# Patient Record
Sex: Female | Born: 1995 | Race: Black or African American | Hispanic: No | Marital: Single | Smoking: Never smoker
Health system: Southern US, Community
[De-identification: ages and names within clinical notes are randomized; demographics above are authoritative.]

## PROBLEM LIST (undated history)

## (undated) HISTORY — PX: TUBAL LIGATION: SHX77

---

## 2018-08-01 ENCOUNTER — Emergency Department (HOSPITAL_COMMUNITY): Payer: Medicaid Other

## 2018-08-01 ENCOUNTER — Emergency Department (HOSPITAL_COMMUNITY)
Admission: EM | Admit: 2018-08-01 | Discharge: 2018-08-01 | Disposition: A | Discharge status: Home / Self Care | Payer: Medicaid Other | Attending: Emergency Medicine | Admitting: Emergency Medicine

## 2018-08-01 ENCOUNTER — Other Ambulatory Visit: Payer: Self-pay

## 2018-08-01 DIAGNOSIS — T1490XA Injury, unspecified, initial encounter: Secondary | ICD-10-CM

## 2018-08-01 DIAGNOSIS — Y999 Unspecified external cause status: Secondary | ICD-10-CM | POA: Insufficient documentation

## 2018-08-01 DIAGNOSIS — S30811A Abrasion of abdominal wall, initial encounter: Secondary | ICD-10-CM | POA: Insufficient documentation

## 2018-08-01 DIAGNOSIS — Y929 Unspecified place or not applicable: Secondary | ICD-10-CM | POA: Insufficient documentation

## 2018-08-01 DIAGNOSIS — T07XXXA Unspecified multiple injuries, initial encounter: Secondary | ICD-10-CM | POA: Diagnosis present

## 2018-08-01 DIAGNOSIS — S3210XA Unspecified fracture of sacrum, initial encounter for closed fracture: Secondary | ICD-10-CM

## 2018-08-01 DIAGNOSIS — M549 Dorsalgia, unspecified: Secondary | ICD-10-CM | POA: Diagnosis not present

## 2018-08-01 DIAGNOSIS — Y939 Activity, unspecified: Secondary | ICD-10-CM | POA: Diagnosis not present

## 2018-08-01 DIAGNOSIS — S01511A Laceration without foreign body of lip, initial encounter: Secondary | ICD-10-CM | POA: Diagnosis not present

## 2018-08-01 DIAGNOSIS — R51 Headache: Secondary | ICD-10-CM | POA: Diagnosis not present

## 2018-08-01 DIAGNOSIS — M542 Cervicalgia: Secondary | ICD-10-CM | POA: Diagnosis not present

## 2018-08-01 DIAGNOSIS — R52 Pain, unspecified: Secondary | ICD-10-CM

## 2018-08-01 LAB — I-STAT CHEM 8, ED
BUN: 9 mg/dL (ref 6–20)
Calcium, Ion: 1.22 mmol/L (ref 1.15–1.40)
Chloride: 107 mmol/L (ref 98–111)
Creatinine, Ser: 1 mg/dL (ref 0.44–1.00)
Glucose, Bld: 112 mg/dL — ABNORMAL HIGH (ref 70–99)
HCT: 37 % (ref 36.0–46.0)
Hemoglobin: 12.6 g/dL (ref 12.0–15.0)
Potassium: 3.8 mmol/L (ref 3.5–5.1)
Sodium: 141 mmol/L (ref 135–145)
TCO2: 26 mmol/L (ref 22–32)

## 2018-08-01 LAB — I-STAT BETA HCG BLOOD, ED (MC, WL, AP ONLY): I-stat hCG, quantitative: <5 m[IU]/mL (ref <5)

## 2018-08-01 LAB — ETHANOL: Alcohol, Ethyl (B): <10 mg/dL (ref <10)

## 2018-08-01 MED ORDER — OXYCODONE-ACETAMINOPHEN 5-325 MG PO TABS: 2.0000 | ORAL_TABLET | ORAL | 0 refills | Status: DC | PRN

## 2018-08-01 MED ORDER — IOHEXOL 300 MG/ML  SOLN
100.0000 mL | Freq: Once | INTRAMUSCULAR | Status: AC | PRN
  Administered 2018-08-01: 100 mL via INTRAVENOUS

## 2018-08-01 MED ORDER — MORPHINE SULFATE (PF) 2 MG/ML IV SOLN
2.0000 mg | Freq: Once | INTRAVENOUS | Status: AC
  Administered 2018-08-01: 2 mg via INTRAVENOUS
  Filled 2018-08-01: qty 1

## 2018-08-01 MED ORDER — OXYCODONE-ACETAMINOPHEN 5-325 MG PO TABS
2.0000 | ORAL_TABLET | Freq: Once | ORAL | Status: AC
  Administered 2018-08-01: 1 via ORAL
  Filled 2018-08-01: qty 2
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
## 2018-08-01 NOTE — ED Notes (Signed)
[phone]; pt provided family's number. No answer. Left message.
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
## 2018-08-01 NOTE — ED Notes (Signed)
Patient transported to CT 

## 2018-08-01 NOTE — ED Notes (Signed)
Attempted to ambulate pt. Not able to stand without immense pain.

## 2018-08-01 NOTE — Discharge Instructions (Signed)
Please return if you have worsening shortness of breath. Take pain medicine as needed for tail bone fracture.   Do not drive until you are seen by neurology and cleared Keep food out of lip cut and use saline swish and swallow

## 2018-08-05 NOTE — ED Notes (Signed)
Only gave pt one percocet per md prior to discharge.

## 2018-08-06 NOTE — ED Notes (Signed)
Extra percocet returned to pharmacy.

## 2018-10-04 ENCOUNTER — Other Ambulatory Visit: Payer: Self-pay

## 2018-10-04 ENCOUNTER — Emergency Department (HOSPITAL_COMMUNITY): Payer: Medicaid Other | Admitting: Anesthesiology

## 2018-10-04 ENCOUNTER — Encounter (HOSPITAL_COMMUNITY)
Admission: EM | Disposition: A | Discharge status: Home / Self Care | Payer: Self-pay | Source: Home / Self Care | Attending: Emergency Medicine

## 2018-10-04 ENCOUNTER — Encounter (HOSPITAL_COMMUNITY): Payer: Self-pay | Admitting: Family Medicine

## 2018-10-04 ENCOUNTER — Ambulatory Visit (HOSPITAL_COMMUNITY)
Admission: EM | Admit: 2018-10-04 | Discharge: 2018-10-04 | Disposition: A | Discharge status: Home / Self Care | Payer: Medicaid Other | Attending: Family Medicine | Admitting: Family Medicine

## 2018-10-04 DIAGNOSIS — D62 Acute posthemorrhagic anemia: Secondary | ICD-10-CM | POA: Diagnosis not present

## 2018-10-04 DIAGNOSIS — X58XXXA Exposure to other specified factors, initial encounter: Secondary | ICD-10-CM | POA: Diagnosis not present

## 2018-10-04 DIAGNOSIS — S39013A Strain of muscle, fascia and tendon of pelvis, initial encounter: Secondary | ICD-10-CM | POA: Diagnosis present

## 2018-10-04 DIAGNOSIS — Y9389 Activity, other specified: Secondary | ICD-10-CM | POA: Insufficient documentation

## 2018-10-04 DIAGNOSIS — I10 Essential (primary) hypertension: Secondary | ICD-10-CM | POA: Diagnosis not present

## 2018-10-04 DIAGNOSIS — S3141XA Laceration without foreign body of vagina and vulva, initial encounter: Secondary | ICD-10-CM | POA: Diagnosis present

## 2018-10-04 DIAGNOSIS — Z79899 Other long term (current) drug therapy: Secondary | ICD-10-CM | POA: Diagnosis not present

## 2018-10-04 DIAGNOSIS — I959 Hypotension, unspecified: Secondary | ICD-10-CM

## 2018-10-04 DIAGNOSIS — N939 Abnormal uterine and vaginal bleeding, unspecified: Secondary | ICD-10-CM | POA: Diagnosis not present

## 2018-10-04 HISTORY — PX: LACERATION REPAIR: SHX5284

## 2018-10-04 LAB — TYPE AND SCREEN
ABO/RH(D): O POS
Antibody Screen: NEGATIVE

## 2018-10-04 LAB — CBC WITH DIFFERENTIAL/PLATELET
Abs Immature Granulocytes: 0.03 10*3/uL (ref 0.00–0.07)
Basophils Absolute: 0 10*3/uL (ref 0.0–0.1)
Basophils Relative: 0 %
Eosinophils Absolute: 0.1 10*3/uL (ref 0.0–0.5)
Eosinophils Relative: 1 %
HCT: 31.1 % — ABNORMAL LOW (ref 36.0–46.0)
Hemoglobin: 9.9 g/dL — ABNORMAL LOW (ref 12.0–15.0)
Immature Granulocytes: 0 %
Lymphocytes Relative: 23 %
Lymphs Abs: 2.7 10*3/uL (ref 0.7–4.0)
MCH: 27.1 pg (ref 26.0–34.0)
MCHC: 31.8 g/dL (ref 30.0–36.0)
MCV: 85.2 fL (ref 80.0–100.0)
Monocytes Absolute: 1 10*3/uL (ref 0.1–1.0)
Monocytes Relative: 8 %
Neutro Abs: 7.7 10*3/uL (ref 1.7–7.7)
Neutrophils Relative %: 68 %
Platelets: 289 10*3/uL (ref 150–400)
RBC: 3.65 MIL/uL — ABNORMAL LOW (ref 3.87–5.11)
RDW: 14.6 % (ref 11.5–15.5)
WBC: 11.4 10*3/uL — ABNORMAL HIGH (ref 4.0–10.5)
nRBC: 0 % (ref 0.0–0.2)

## 2018-10-04 LAB — BASIC METABOLIC PANEL
Anion gap: 8 (ref 5–15)
BUN: 6 mg/dL (ref 6–20)
CO2: 23 mmol/L (ref 22–32)
Calcium: 8.7 mg/dL — ABNORMAL LOW (ref 8.9–10.3)
Chloride: 106 mmol/L (ref 98–111)
Creatinine, Ser: 1.03 mg/dL — ABNORMAL HIGH (ref 0.44–1.00)
GFR calc Af Amer: >60 mL/min (ref >60)
GFR calc non Af Amer: >60 mL/min (ref >60)
Glucose, Bld: 147 mg/dL — ABNORMAL HIGH (ref 70–99)
Potassium: 3.1 mmol/L — ABNORMAL LOW (ref 3.5–5.1)
Sodium: 137 mmol/L (ref 135–145)

## 2018-10-04 LAB — WET PREP, GENITAL
Clue Cells Wet Prep HPF POC: NONE SEEN
Sperm: NONE SEEN
Trich, Wet Prep: NONE SEEN
Yeast Wet Prep HPF POC: NONE SEEN

## 2018-10-04 LAB — HCG, QUANTITATIVE, PREGNANCY: hCG, Beta Chain, Quant, S: <1 m[IU]/mL (ref <5)

## 2018-10-04 LAB — HIV ANTIBODY (ROUTINE TESTING W REFLEX): HIV Screen 4th Generation wRfx: NONREACTIVE

## 2018-10-04 LAB — ABO/RH: ABO/RH(D): O POS

## 2018-10-04 LAB — SARS CORONAVIRUS 2 BY RT PCR (HOSPITAL ORDER, PERFORMED IN ~~LOC~~ HOSPITAL LAB): SARS Coronavirus 2: NEGATIVE

## 2018-10-04 LAB — RPR: RPR Ser Ql: NONREACTIVE

## 2018-10-04 SURGERY — REPAIR, LACERATION, 2 OR MORE
Anesthesia: General | Site: Vagina

## 2018-10-04 MED ORDER — GLYCOPYRROLATE PF 0.2 MG/ML IJ SOSY
PREFILLED_SYRINGE | INTRAMUSCULAR | Status: DC | PRN
  Administered 2018-10-04: .2 mg via INTRAVENOUS

## 2018-10-04 MED ORDER — IBUPROFEN 800 MG PO TABS: 800.0000 mg | ORAL_TABLET | Freq: Three times a day (TID) | ORAL | 0 refills | Status: AC | PRN

## 2018-10-04 MED ORDER — SODIUM CHLORIDE 0.9 % IV BOLUS: 1000.0000 mL | Freq: Once | INTRAVENOUS | Status: DC

## 2018-10-04 MED ORDER — MIDAZOLAM HCL 2 MG/2ML IJ SOLN
INTRAMUSCULAR | Status: AC
  Filled 2018-10-04: qty 2

## 2018-10-04 MED ORDER — ONDANSETRON HCL 4 MG/2ML IJ SOLN
4.0000 mg | Freq: Once | INTRAMUSCULAR | Status: AC
  Administered 2018-10-04: 4 mg via INTRAVENOUS
  Filled 2018-10-04: qty 2

## 2018-10-04 MED ORDER — ONDANSETRON HCL 4 MG/2ML IJ SOLN
INTRAMUSCULAR | Status: DC | PRN
  Administered 2018-10-04: 4 mg via INTRAVENOUS

## 2018-10-04 MED ORDER — MIDAZOLAM HCL 2 MG/2ML IJ SOLN
INTRAMUSCULAR | Status: DC | PRN
  Administered 2018-10-04: 2 mg via INTRAVENOUS

## 2018-10-04 MED ORDER — STERILE WATER FOR INJECTION IJ SOLN
INTRAMUSCULAR | Status: AC
  Administered 2018-10-04: 09:00:00 1.2 mL
  Filled 2018-10-04: qty 10

## 2018-10-04 MED ORDER — BUPIVACAINE HCL (PF) 0.5 % IJ SOLN
INTRAMUSCULAR | Status: AC
  Filled 2018-10-04: qty 30

## 2018-10-04 MED ORDER — FENTANYL CITRATE (PF) 250 MCG/5ML IJ SOLN
INTRAMUSCULAR | Status: AC
  Filled 2018-10-04: qty 5

## 2018-10-04 MED ORDER — SUCCINYLCHOLINE CHLORIDE 200 MG/10ML IV SOSY
PREFILLED_SYRINGE | INTRAVENOUS | Status: DC | PRN
  Administered 2018-10-04: 160 mg via INTRAVENOUS

## 2018-10-04 MED ORDER — SODIUM CHLORIDE 0.9 % IV BOLUS
1000.0000 mL | Freq: Once | INTRAVENOUS | Status: AC
  Administered 2018-10-04: 08:00:00 1000 mL via INTRAVENOUS

## 2018-10-04 MED ORDER — PROPOFOL 10 MG/ML IV BOLUS
INTRAVENOUS | Status: DC | PRN
  Administered 2018-10-04: 130 mg via INTRAVENOUS

## 2018-10-04 MED ORDER — LIDOCAINE 2% (20 MG/ML) 5 ML SYRINGE
INTRAMUSCULAR | Status: DC | PRN
  Administered 2018-10-04: 60 mg via INTRAVENOUS

## 2018-10-04 MED ORDER — LACTATED RINGERS IV SOLN
INTRAVENOUS | Status: DC
  Administered 2018-10-04: 16:00:00 via INTRAVENOUS

## 2018-10-04 MED ORDER — AZITHROMYCIN 250 MG PO TABS
1000.0000 mg | ORAL_TABLET | Freq: Once | ORAL | Status: AC
  Administered 2018-10-04: 09:00:00 1000 mg via ORAL
  Filled 2018-10-04: qty 4

## 2018-10-04 MED ORDER — PROPOFOL 10 MG/ML IV BOLUS
INTRAVENOUS | Status: AC
  Filled 2018-10-04: qty 40

## 2018-10-04 MED ORDER — FENTANYL CITRATE (PF) 250 MCG/5ML IJ SOLN
INTRAMUSCULAR | Status: DC | PRN
  Administered 2018-10-04: 50 ug via INTRAVENOUS
  Administered 2018-10-04: 100 ug via INTRAVENOUS

## 2018-10-04 MED ORDER — CEFTRIAXONE SODIUM 250 MG IJ SOLR
250.0000 mg | Freq: Once | INTRAMUSCULAR | Status: AC
  Administered 2018-10-04: 09:00:00 250 mg via INTRAMUSCULAR
  Filled 2018-10-04: qty 250

## 2018-10-04 SURGICAL SUPPLY — 19 items
BLADE SURG 15 STRL LF DISP TIS (BLADE) ×1 IMPLANT
BLADE SURG 15 STRL SS (BLADE) ×2
CLOTH BEACON ORANGE TIMEOUT ST (SAFETY) ×3 IMPLANT
CONTAINER PREFILL 10% NBF 60ML (FORM) IMPLANT
COVER WAND RF STERILE (DRAPES) ×3 IMPLANT
GLOVE BIOGEL PI IND STRL 7.0 (GLOVE) ×2 IMPLANT
GLOVE BIOGEL PI INDICATOR 7.0 (GLOVE) ×4
GLOVE ECLIPSE 7.0 STRL STRAW (GLOVE) ×3 IMPLANT
GOWN STRL REUS W/ TWL LRG LVL3 (GOWN DISPOSABLE) ×2 IMPLANT
GOWN STRL REUS W/TWL LRG LVL3 (GOWN DISPOSABLE) ×4
KIT TURNOVER KIT B (KITS) ×3 IMPLANT
PACK VAGINAL MINOR WOMEN LF (CUSTOM PROCEDURE TRAY) ×3 IMPLANT
SUT CHROMIC 3 0 SH 27 (SUTURE) IMPLANT
SUT VIC AB 0 CT1 27 (SUTURE) ×2
SUT VIC AB 0 CT1 27XBRD ANBCTR (SUTURE) ×1 IMPLANT
SUT VIC AB 3-0 SH 27 (SUTURE) ×2
SUT VIC AB 3-0 SH 27XBRD (SUTURE) ×1 IMPLANT
TOWEL GREEN STERILE FF (TOWEL DISPOSABLE) ×6 IMPLANT
UNDERPAD 30X30 (UNDERPADS AND DIAPERS) ×3 IMPLANT

## 2018-10-04 NOTE — Transfer of Care (Signed)
Immediate Anesthesia Transfer of Care Note  Patient: Linda Logan  Procedure(s) Performed: VAGINAL LACERATION REPAIR (N/A Vagina )  Patient Location: PACU  Anesthesia Type:General  Level of Consciousness: awake, alert  and oriented  Airway & Oxygen Therapy: Patient Spontanous Breathing and Patient connected to face mask oxygen  Post-op Assessment: Report given to RN and Post -op Vital signs reviewed and stable  Post vital signs: Reviewed and stable  Last Vitals:  Vitals Value Taken Time  BP 118/77 10/04/18 1641  Temp 36.6 C 10/04/18 1641  Pulse 73 10/04/18 1641  Resp 19 10/04/18 1641  SpO2 100 % 10/04/18 1641    Last Pain:  Vitals:   10/04/18 1641  TempSrc:   PainSc: Asleep         Complications: No apparent anesthesia complications

## 2018-10-04 NOTE — Anesthesia Preprocedure Evaluation (Signed)
Anesthesia Evaluation  Patient identified by MRN, date of birth, ID band Patient awake    Reviewed: Allergy & Precautions, NPO status , Patient's Chart, lab work & pertinent test results  Airway Mallampati: I  TM Distance: >3 FB Neck ROM: Full    Dental   Pulmonary    Pulmonary exam normal        Cardiovascular Normal cardiovascular exam     Neuro/Psych    GI/Hepatic   Endo/Other    Renal/GU      Musculoskeletal   Abdominal   Peds  Hematology   Anesthesia Other Findings   Reproductive/Obstetrics                             Anesthesia Physical Anesthesia Plan  ASA: II and emergent  Anesthesia Plan: General   Post-op Pain Management:    Induction: Rapid sequence, Cricoid pressure planned and Intravenous  PONV Risk Score and Plan: 3 and Ondansetron and Midazolam  Airway Management Planned: Oral ETT  Additional Equipment:   Intra-op Plan:   Post-operative Plan: Extubation in OR  Informed Consent: I have reviewed the patients History and Physical, chart, labs and discussed the procedure including the risks, benefits and alternatives for the proposed anesthesia with the patient or authorized representative who has indicated his/her understanding and acceptance.       Plan Discussed with: CRNA and Surgeon  Anesthesia Plan Comments:         Anesthesia Quick Evaluation

## 2018-10-04 NOTE — ED Triage Notes (Signed)
Patient complains of increasing vaginal bleeding for 2 days. She had surgery in April and has not had problems until now.

## 2018-10-04 NOTE — H&P (Signed)
  Linda Logan is an 23 y.o. [phone] female.   Chief Complaint: vaginal bleeding HPI: Presented to   History reviewed. No pertinent past medical history.  Past Surgical History:  Procedure Laterality Date  . CESAREAN SECTION     x 2  . TUBAL LIGATION      Family History  Problem Relation Age of Onset  . Hypertension Maternal Grandmother    Social History:  reports that she has never smoked. She does not have any smokeless tobacco history on file. She reports previous alcohol use. She reports previous drug use.  Allergies: No Known Allergies  (Not in a hospital admission)   A comprehensive review of systems was negative.  Blood pressure 99/68, pulse (!) 53, temperature (!) 97.5 F (36.4 C), temperature source Oral, resp. rate 17, height 5\' 5"  (1.651 m), weight 88.5 kg, SpO2 100 %. General appearance: alert, cooperative and appears stated age Head: Normocephalic, without obvious abnormality, atraumatic Neck: supple, symmetrical, trachea midline Lungs: normal effort Heart: regular rate and rhythm and bradycardia Abdomen: soft, non-tender; bowel sounds normal; no masses,  no organomegaly Pelvic: Per ED provider, 4 cm vaginal laceration, heavy bleeding noted Extremities: Homans sign is negative, no sign of DVT Skin: Skin color, texture, turgor normal. No rashes or lesions Neurologic: Grossly normal   Lab Results  Component Value Date   WBC 11.4 (H) 10/04/2018   HGB 9.9 (L) 10/04/2018   HCT 31.1 (L) 10/04/2018   MCV 85.2 10/04/2018   PLT 289 10/04/2018   Lab Results  Component Value Date   HCG <5.0 08/01/2018     Assessment/Plan Principal Problem:   Vaginal laceration Active Problems:   Acute blood loss anemia  To OR for vaginal laceration repair. COVID screening. Risks include but are not limited to bleeding, infection, injury to surrounding structures, including bowel, bladder and ureters, blood clots, and death.  Likelihood of success is high.    Linda Logan 10/04/2018, 11:33 AM

## 2018-10-04 NOTE — ED Notes (Signed)
Cell phone locked with security. Envelope [id number]

## 2018-10-04 NOTE — Discharge Instructions (Signed)
Care of a Perineal Tear A perineal tear is a cut or tear (laceration) in the tissue between the opening of the vagina and the anus (perineum). Some women develop a perineal tear during a vaginal birth. This can happen as the baby emerges from the birth canal and the perineum is stretched. There are four degrees of perineal tears based on how deep and long the laceration is:  First degree. This involves a shallow tear at the edge of the vaginal opening that extends slightly into the perineal skin.  Second degree. This involves tearing described in first degree perineal tear, and an additional deeper tear of the vaginal opening and perineal tissues. It may also include tearing of a muscle just under the perineal skin.  Third degree. This involves tearing described in first and second degree perineal tears, with the addition that tearing in the third degree extends into the muscle of the anus (anal sphincter).  Fourth degree. This involves all levels of tears described in first, second, and third degree perineal tears, with the tear in the fourth degree extending into the rectum. First and second degree perineal tears may or may not be stitched closed, depending on their location and appearance. Third and fourth degree perineal tears are stitched closed immediately after the babys birth. What are the risks? Depending on the type of perineal tear you have, you may be at risk for:  Bleeding.  Developing a collection of blood in the perineal tear area (hematoma).  Pain. This may include pain when you urinate, or pain when you have a bowel movement.  Infection at the site of the tear.  Fever.  Trouble controlling your urination or bowels (incontinence).  Painful sex. How to care for a perineal tear Wound care  Take a sitz bath as told by your health care provider. A sitz bath is a warm water bath that is taken while you are sitting down. The water should only come up to your hips and should  cover your buttocks. This can speed up healing. ? Partially fill a bathtub with warm water. You will only need the water to be deep enough to cover your hips and buttocks when you are sitting in it. ? If your health care provider told you to put medicine in the water, follow the directions exactly as told. ? Sit in the water and open the tub drain a little. ? Turn on the warm water again to keep the tub at the correct level. Keep the water running constantly. ? Soak in the water for 15-20 minutes or as told by your health care provider. ? After the sitz bath, pat the affected area dry first. Do not rub it. ? Be careful when you stand up after the sitz bath because you may feel dizzy.  Wash your hands before and after applying medicine to the area.  Wear a sanitary pad as told by your health care provider. Change the pad as often as told by your health care provider.  Leave stitches (sutures), skin glue, or adhesive strips in place. These skin closures may need to stay in place for 2 weeks or longer. If adhesive strip edges start to loosen and curl up, you may trim the loose edges. Do not remove adhesive strips completely unless your health care provider tells you to do that.  Check your wound every day for signs of infection. Check for: ? Redness, swelling, or pain. ? Fluid or blood. ? Warmth. ? Pus or a bad  smell. Managing pain  If directed, put ice on the painful area: ? Put ice in a plastic bag. ? Place a towel between your skin and the bag. ? Leave the ice on for 20 minutes, 2-3 times a day.  Apply a numbing spray to the perineal tear site as told by your health care provider. This may help with discomfort.  Take and apply over-the-counter and prescription medicines only as told by your health care provider.  If told, put about 3 witch hazel-containing hemorrhoid treatment pads on top of your sanitary pad. The witch hazel in the hemorrhoid pads helps with swelling and  discomfort.  Sit on an inflatable ring or pillow. This may provide comfort. General instructions  Squeeze warm water on your perineum after urinating. This should be done from front to back with a squeeze bottle. Pat the area to dry it.  Do not have sex, use tampons, or place anything in your vagina for at least 6 weeks or as told by your health care provider.  Keep all follow-up visits as told by your health care provider. These include any postpartum visits. This is important. Contact a health care provider if:  Your pain is not relieved with medicines.  You have painful urination.  You have redness, swelling, or pain around your tear.  You have fluid or blood coming from your tear.  Your tear feels warm to the touch.  You have pus or a bad smell coming from your tear.  You have a fever. Get help right away if:  Your tear opens.  You cannot urinate.  You have an increase in bleeding.  You have severe pain. Summary  A perineal tear is a cut or tear (laceration) in the tissue between the opening of the vagina and the anus (perineum).  There are four degrees of perineal tears based on how deep and long the laceration is.  First and second-degree perineal tears may or may not be stitched closed, depending on their location and appearance. Third and fourth- degree perineal tears are stitched closed immediately after the babys birth.  Follow your health care provider's instructions for caring for your perineal tear. Know how to manage pain and how to care for your wound. Know when to call your health care provider and when to seek immediate emergency care. This information is not intended to replace advice given to you by your health care provider. Make sure you discuss any questions you have with your health care provider. Document Released: 06/03/2013 Document Revised: 12/30/2016 Document Reviewed: 02/22/2016 Elsevier Patient Education  2020 [employer] Vaginal  Laceration  A vaginal laceration is a cut or tear of the opening of the vagina, the inside of the vaginal canal, or the skin between the vaginal opening and the anus (perineum). What are the causes? This condition may be caused by:  Childbirth. It may also be caused by tools that are used to help deliver a baby, such as forceps.  Sex.  An injury from sports, bike riding, or other activities.  Thinning, dryness, or irritation of the vagina due to low estrogen levels (vulvovaginal atrophy). What increases the risk? You are more likely to develop this condition if you:  Give birth vaginally.  Are sexually active.  Have gone through menopause.  Have low estrogen levels due to certain medicines, breast cancer treatments, or breastfeeding. What are the signs or symptoms? Symptoms of this condition include:  Slight to heavy vaginal bleeding.  Vaginal swelling.  Mild to  severe pain.  Vaginal tenderness.  Painful urination.  Pain or discomfort during sex. How is this diagnosed? If the tear happened during childbirth, your health care provider can diagnose the tear at that time. Other tears or lacerations can be diagnosed with your medical history and a physical exam. You may have other tests, including:  Blood tests to check your hormone levels and blood loss.  Imaging tests, such as an ultrasonogram or CT scan, to rule out other health issues, such as enlarged lymph nodes or tumors. How is this treated? Treatment depends on the severity of the tear or laceration. Minor injuries may heal on their own. If needed, this condition may be treated with:  Stitches (sutures).  Medicines, such as: ? Creams to reduce pain. ? Vaginal lubricants to treat vaginal dryness. ? Topical or oral hormonal therapy. ? Antibiotics. These may be taken orally or given as ointments to prevent or treat infection. Surgery may be needed if the tear is severe. Follow these instructions at home: Wound  care  Follow instructions from your health care provider about how to take care of your wound. Make sure you: ? Wash your hands with soap and water before and after you change your bandage (dressing). If soap and water are not available, use hand sanitizer. ? Change your dressing as told by your health care provider. ? Keep the area clean. ? Leave sutures in place, if this applies. These skin closures may need to stay in place for 2 weeks or longer.  Check your wound every day for signs of infection. Check for: ? Redness, swelling, or pain. ? Fluid or blood. ? Warmth. ? Pus or a bad smell. Managing pain and swelling   If directed, put ice on the injured area: ? Put ice in a plastic bag. ? Place a towel between your skin and the bag. ? Leave the ice on for 20 minutes, 2-3 times a day. Medicines  Take or apply over-the-counter and prescription medicines only as told by your health care provider.  If you were prescribed an antibiotic medicine, take it as told by your health care provider. Do not stop using the antibiotic even if you start to feel better.  Ask your health care provider if the medicine prescribed to you: ? Requires you to avoid driving or using heavy machinery. ? Can cause constipation. You may need to take actions to prevent or treat constipation, such as:  Drink enough fluid to keep your urine pale yellow.  Take over-the-counter or prescription medicines.  Eat foods that are high in fiber, such as beans, whole grains, and fresh fruits and vegetables.  Limit foods that are high in fat and processed sugars, such as fried or sweet foods. General instructions   Take a sitz bath 2-3 times a day or as told by your health care provider. A sitz bath is a shallow, warm water bath that is taken while you are sitting down. The water should only come up to your hips and should cover your buttocks.  Avoid sitting or standing for long periods of time.  Lie on your side  while sleeping or resting.  Avoid straining during bowel movements. Ask your health care provider if a stool softener is needed.  Do not douche, use a tampon, or have sex until your health care provider approves.  Keep all follow-up visits as told by your health care provider. This is important. Contact a health care provider if:  You have: ? More redness,  swelling, or pain in the vaginal area. ? More fluid or blood coming from your vaginal tear. ? Pus or a bad smell coming from your vaginal area. ? A fever. ? A tear that breaks open after it healed or was repaired. ? A burning pain when you urinate.  You continue to have pain during sex after the tear heals.  You are urinating more often than usual or feel an increased urgency to urinate. Get help right away if you:  Feel light-headed.  Have nausea or vomiting.  Have severe pain around your vagina or in your pelvis or lower abdomen.  Have heavy vaginal bleeding, or you are soaking more than 1 pad an hour. Summary  A vaginal laceration is a cut or tear of the opening of the vagina, the inside of the vaginal canal, or the skin between the vaginal opening and the anus (perineum).  It is caused by childbirth, sex, injury, or thinning, dryness, or irritation of the vagina due to low estrogen levels.  Treatment depends on the severity of the tear or laceration. Minor injuries may heal on their own. It may be treated with stitches, medicines, or surgery if the tear is severe. This information is not intended to replace advice given to you by your health care provider. Make sure you discuss any questions you have with your health care provider. Document Released: 01/17/2005 Document Revised: 08/31/2017 Document Reviewed: 08/31/2017 Elsevier Patient Education  2020 [employer].

## 2018-10-04 NOTE — ED Provider Notes (Signed)
[facility] Provider Note   CSN: [id number] Arrival date & time: 10/04/18  0708     History   Chief Complaint Chief Complaint  Patient presents with  . Vaginal Bleeding    HPI Linda Logan is a 23 y.o. female.     The history is provided by the patient. No language interpreter was used.  Vaginal Bleeding    23 year old female G2 P2 presenting for evaluation of vaginal bleeding.  Patient report for the past 2 days she has had persistent vaginal bleeding of unknown amount.  She states she is bleeding a lot but currently not using any pads or tampons for it.  She also endorsed feeling lightheadedness and dizzy and felt that she has been losing a significant amount of blood.  She also noticed blood clots.  She mention having tubal ligation April and last menstrual period was last week.  She denies any recent sick contact.  No fever chills no chest pain shortness of breath or productive cough.  She does endorse some mild nausea and 1 episodes of diarrhea.  No past medical history on file.  There are no active problems to display for this patient.   The histories are not reviewed yet. Please review them in the "History" navigator section and refresh this SmartLink.   OB History   No obstetric history on file.      Home Medications    Prior to Admission medications   Medication Sig Start Date End Date Taking? Authorizing Provider  oxyCODONE-acetaminophen (PERCOCET/ROXICET) 5-325 MG tablet Take 2 tablets by mouth every 4 (four) hours as needed for severe pain. 08/01/18   Margarita Grizzleay, Danielle, MD    Family History No family history on file.  Social History Social History   Tobacco Use  . Smoking status: Not on file  Substance Use Topics  . Alcohol use: Not on file  . Drug use: Not on file     Allergies   Patient has no allergy information on record.   Review of Systems Review of Systems  Genitourinary: Positive for vaginal bleeding.   All other systems reviewed and are negative.    Physical Exam Updated Vital Signs BP (!) 82/54 (BP Location: Right Arm)   Pulse (!) 44   Temp (!) 97.5 F (36.4 C) (Oral)   Resp 18   Ht 5\' 5"  (1.651 m)   Wt 88.5 kg   SpO2 99%   BMI 32.45 kg/m   Physical Exam Vitals signs and nursing note reviewed.  Constitutional:      General: She is not in acute distress.    Appearance: She is well-developed.     Comments: Patient is drowsy but arousable and answer question appropriately.  HENT:     Head: Atraumatic.  Eyes:     Conjunctiva/sclera: Conjunctivae normal.  Neck:     Musculoskeletal: Neck supple.  Cardiovascular:     Rate and Rhythm: Bradycardia present.     Pulses: Normal pulses.     Heart sounds: Normal heart sounds.  Pulmonary:     Effort: Pulmonary effort is normal.     Breath sounds: Normal breath sounds.  Abdominal:     Palpations: Abdomen is soft.     Tenderness: There is no abdominal tenderness.  Genitourinary:    Comments: Chaperone present during exam.  No inguinal lymphadenopathy or inguinal hernia noted. Normal external genitalia.  On speculum exam copious amount of blood and clots filled vaginal vault.  After extensive cleansing using  fox swab, a 4cm deep vaginal tear noted to left side of vaginal vault were identified. Exam was limited due to pt's discomfort.  Skin:    Findings: No rash.  Neurological:     Mental Status: She is alert.      ED Treatments / Results  Labs (all labs ordered are listed, but only abnormal results are displayed) Labs Reviewed  WET PREP, GENITAL - Abnormal; Notable for the following components:      Result Value   WBC, Wet Prep HPF POC MANY (*)    All other components within normal limits  CBC WITH DIFFERENTIAL/PLATELET - Abnormal; Notable for the following components:   WBC 11.4 (*)    RBC 3.65 (*)    Hemoglobin 9.9 (*)    HCT 31.1 (*)    All other components within normal limits  BASIC METABOLIC PANEL - Abnormal;  Notable for the following components:   Potassium 3.1 (*)    Glucose, Bld 147 (*)    Creatinine, Ser 1.03 (*)    Calcium 8.7 (*)    All other components within normal limits  SARS CORONAVIRUS 2 (HOSPITAL ORDER, PERFORMED IN [facility])  HCG, QUANTITATIVE, PREGNANCY  RPR  HIV ANTIBODY (ROUTINE TESTING W REFLEX)  URINALYSIS, ROUTINE W REFLEX MICROSCOPIC  TYPE AND SCREEN  ABO/RH  GC/CHLAMYDIA PROBE AMP ([place]) NOT AT [facility]    EKG None  Radiology No results found.  Procedures Procedures (including critical care time)  Medications Ordered in ED Medications  sodium chloride 0.9 % bolus 1,000 mL (1,000 mLs Intravenous New Bag/Given 10/04/18 0816)  ondansetron (ZOFRAN) injection 4 mg (4 mg Intravenous Given 10/04/18 0817)  cefTRIAXone (ROCEPHIN) injection 250 mg (250 mg Intramuscular Given 10/04/18 0924)  azithromycin (ZITHROMAX) tablet 1,000 mg (1,000 mg Oral Given 10/04/18 0923)  sterile water (preservative free) injection (1.2 mLs  Given 10/04/18 0929)     Initial Impression / Assessment and Plan / ED Course  I have reviewed the triage vital signs and the nursing notes.  Pertinent labs & imaging results that were available during my care of the patient were reviewed by me and considered in my medical decision making (see chart for details).        BP (!) 97/58   Pulse 93   Temp (!) 97.5 F (36.4 C) (Oral)   Resp 17   Ht 5\' 5"  (1.651 m)   Wt 88.5 kg   SpO2 99%   BMI 32.45 kg/m    Final Clinical Impressions(s) / ED Diagnoses   Final diagnoses:  Tear of vaginal muscle, initial encounter    ED Discharge Orders    None     8:44 AM Patient report vaginal bleeding for the past 2 night.  She does have copious amount of blood and clots in the vaginal vault on examination.  Her cervical os is closed.  Patient appears to have a large vaginal tear noted to the left lateral vaginal wall difficult to fully visualize due to patient's discomfort as well as  bleeding.  She admits to having painful sexual course the night before as well as the night before that. Suspect vaginal tear from sexual intercourse. Currently her hemoglobin is 9.9.  She is mildly hypertensive with a systolic blood pressures of 82.  Will give IV fluid.  Care discussed with Dr. Pilar Plate.   10:54 AM On reexamination after patient is more comfortable, I was able to have a better visualizations of the laceration in patient's vaginal vault.  Appears to be approximately 4 cm in diameter running horizontally along the plane of the vaginal vault.  Plan to consult OBGYN for laceration repair.    10:58 AM Appreciate consultation from OBGYN Dr. Kennon Rounds who agrees to see pt and will perform lac repair.   11:35 AM OB have evaluated pt and recommend laceration repair on the OR.  Will obtain COVID swab.     Domenic Moras, PA-C 10/04/18 1225    Maudie Flakes, MD 10/05/18 [phone]

## 2018-10-04 NOTE — ED Notes (Signed)
Per patient she would like to update her mother instead of this RN doing so.

## 2018-10-04 NOTE — Anesthesia Procedure Notes (Signed)
Procedure Name: Intubation Date/Time: 10/04/2018 4:00 PM Performed by: Valda Favia, CRNA Pre-anesthesia Checklist: Patient identified, Emergency Drugs available, Suction available, Patient being monitored and Timeout performed Patient Re-evaluated:Patient Re-evaluated prior to induction Oxygen Delivery Method: Circle system utilized Preoxygenation: Pre-oxygenation with 100% oxygen Induction Type: IV induction and Rapid sequence Laryngoscope Size: Mac and 4 Grade View: Grade I Tube type: Oral Tube size: 7.0 mm Number of attempts: 1 Airway Equipment and Method: Stylet Placement Confirmation: ETT inserted through vocal cords under direct vision,  positive ETCO2 and breath sounds checked- equal and bilateral Secured at: 20 cm Tube secured with: Tape Dental Injury: Teeth and Oropharynx as per pre-operative assessment
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
## 2018-10-05 ENCOUNTER — Encounter (HOSPITAL_COMMUNITY): Payer: Self-pay | Admitting: Family Medicine

## 2018-10-05 LAB — GC/CHLAMYDIA PROBE AMP (~~LOC~~) NOT AT ARMC
Chlamydia: NEGATIVE
Neisseria Gonorrhea: NEGATIVE

## 2018-10-05 NOTE — Anesthesia Postprocedure Evaluation (Signed)
Anesthesia Post Note  Patient: Community education officer  Procedure(s) Performed: VAGINAL LACERATION REPAIR (N/A Vagina )     Patient location during evaluation: PACU Anesthesia Type: General Level of consciousness: awake and alert Pain management: pain level controlled Vital Signs Assessment: post-procedure vital signs reviewed and stable Respiratory status: spontaneous breathing, nonlabored ventilation, respiratory function stable and patient connected to nasal cannula oxygen Cardiovascular status: blood pressure returned to baseline and stable Postop Assessment: no apparent nausea or vomiting Anesthetic complications: no    Last Vitals:  Vitals:   10/04/18 1710 10/04/18 1725  BP: 112/76 113/75  Pulse: 81 71  Resp: 14 16  Temp:  36.6 C  SpO2: 100% 100%    Last Pain:  Vitals:   10/04/18 1725  TempSrc:   PainSc: 0-No pain                 Linda Logan DAVID

## 2018-10-24 ENCOUNTER — Ambulatory Visit: Payer: Medicaid Other | Admitting: Family Medicine

## 2018-10-24 ENCOUNTER — Encounter: Payer: Self-pay | Admitting: Family Medicine

## 2018-10-24 NOTE — Progress Notes (Signed)
Patient did not keep appointment today. She may call to reschedule.  
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
